# Patient Record
Sex: Female | Born: 1975 | Race: White | Hispanic: Yes | Marital: Single | Smoking: Never smoker
Health system: Southern US, Community
[De-identification: ages and names within clinical notes are randomized; demographics above are authoritative.]

## PROBLEM LIST (undated history)

## (undated) DIAGNOSIS — I10 Essential (primary) hypertension: Secondary | ICD-10-CM

## (undated) DIAGNOSIS — E7800 Pure hypercholesterolemia, unspecified: Secondary | ICD-10-CM

## (undated) DIAGNOSIS — E119 Type 2 diabetes mellitus without complications: Secondary | ICD-10-CM

---

## 2005-03-04 ENCOUNTER — Ambulatory Visit: Payer: Self-pay | Admitting: Family Medicine

## 2005-09-14 ENCOUNTER — Inpatient Hospital Stay: Payer: Self-pay

## 2005-09-14 ENCOUNTER — Ambulatory Visit: Payer: Self-pay | Admitting: Family Medicine

## 2005-10-04 ENCOUNTER — Inpatient Hospital Stay (HOSPITAL_COMMUNITY): Admission: AD | Admit: 2005-10-04 | Discharge: 2005-10-05 | Payer: Self-pay | Admitting: Family Medicine

## 2006-07-28 ENCOUNTER — Inpatient Hospital Stay (HOSPITAL_COMMUNITY): Admission: AD | Admit: 2006-07-28 | Discharge: 2006-07-30 | Payer: Self-pay | Admitting: Obstetrics & Gynecology

## 2014-09-20 ENCOUNTER — Emergency Department (HOSPITAL_COMMUNITY)
Admission: EM | Admit: 2014-09-20 | Discharge: 2014-09-21 | Disposition: A | Discharge status: Home / Self Care | Payer: Self-pay | Attending: Emergency Medicine | Admitting: Emergency Medicine

## 2014-09-20 ENCOUNTER — Encounter (HOSPITAL_COMMUNITY): Payer: Self-pay | Admitting: Emergency Medicine

## 2014-09-20 DIAGNOSIS — I10 Essential (primary) hypertension: Secondary | ICD-10-CM | POA: Insufficient documentation

## 2014-09-20 DIAGNOSIS — E119 Type 2 diabetes mellitus without complications: Secondary | ICD-10-CM | POA: Insufficient documentation

## 2014-09-20 DIAGNOSIS — Z8639 Personal history of other endocrine, nutritional and metabolic disease: Secondary | ICD-10-CM | POA: Insufficient documentation

## 2014-09-20 HISTORY — DX: Pure hypercholesterolemia, unspecified: E78.00

## 2014-09-20 HISTORY — DX: Type 2 diabetes mellitus without complications: E11.9

## 2014-09-20 HISTORY — DX: Essential (primary) hypertension: I10

## 2014-09-20 LAB — I-STAT CHEM 8, ED
BUN: 11 mg/dL (ref 6–20)
CREATININE: 0.5 mg/dL (ref 0.44–1.00)
Calcium, Ion: 1.16 mmol/L (ref 1.12–1.23)
Chloride: 101 mmol/L (ref 101–111)
Glucose, Bld: 227 mg/dL — ABNORMAL HIGH (ref 65–99)
HCT: 46 % (ref 36.0–46.0)
Hemoglobin: 15.6 g/dL — ABNORMAL HIGH (ref 12.0–15.0)
Potassium: 4.2 mmol/L (ref 3.5–5.1)
SODIUM: 137 mmol/L (ref 135–145)
TCO2: 26 mmol/L (ref 0–100)

## 2014-09-20 MED ORDER — LISINOPRIL-HYDROCHLOROTHIAZIDE 10-12.5 MG PO TABS: 1.0000 | ORAL_TABLET | Freq: Every day | ORAL | Status: AC

## 2014-09-20 MED ORDER — HYDROCHLOROTHIAZIDE 12.5 MG PO CAPS
12.5000 mg | ORAL_CAPSULE | Freq: Every day | ORAL | Status: DC
  Filled 2014-09-20: qty 1

## 2014-09-20 MED ORDER — LISINOPRIL 10 MG PO TABS
10.0000 mg | ORAL_TABLET | Freq: Once | ORAL | Status: DC
  Filled 2014-09-20: qty 1

## 2014-09-20 NOTE — Discharge Instructions (Signed)
Hipertensión °(Hypertension) °La hipertensión, conocida comúnmente como presión arterial alta, se produce cuando la sangre bombea en las arterias con mucha fuerza. Las arterias son los vasos sanguíneos que transportan la sangre desde el corazón hacia todas las partes del cuerpo. Una lectura de la presión arterial consiste en un número más alto sobre un número más bajo, por ejemplo, 110/72. El número más alto (presión sistólica) corresponde a la presión interna de las arterias cuando el corazón bombea sangre. El número más bajo (presión diastólica) corresponde a la presión interna de las arterias cuando el corazón se relaja. En condiciones ideales, la presión arterial debe ser inferior a 120/80. °La hipertensión fuerza al corazón a trabajar más para bombear la sangre. Las arterias pueden estrecharse o ponerse rígidas. La hipertensión conlleva el riesgo de enfermedad cardíaca, ictus y otros problemas.  °FACTORES DE RIESGO °Algunos factores de riesgo de hipertensión son controlables, pero otros no lo son.  °Entre los factores de riesgo que usted no puede controlar, se incluyen:  °· La raza. El riesgo es mayor para las personas afroamericanas. °· La edad. Los riesgos aumentan con la edad. °· El sexo. Antes de los 45 años, los hombres corren más riesgo que las mujeres. Después de los 65 años, las mujeres corren más riesgo que los hombres. °Entre los factores de riesgo que usted puede controlar, se incluyen: °· No hacer la cantidad suficiente de actividad física o ejercicio. °· Tener sobrepeso. °· Consumir mucha grasa, azúcar, calorías o sal en la dieta. °· Beber alcohol en exceso. °SIGNOS Y SÍNTOMAS °Por lo general, la hipertensión no causa signos o síntomas. La hipertensión demasiado alta (crisis hipertensiva) puede causar dolor de cabeza, ansiedad, falta de aire y hemorragia nasal. °DIAGNÓSTICO  °Para detectar si usted tiene hipertensión, el médico le medirá la presión arterial mientras esté sentado, con el brazo  levantado a la altura del corazón. Debe medirla al menos dos veces en el mismo brazo. Determinadas condiciones pueden causar una diferencia de presión arterial entre el brazo izquierdo y el derecho. El hecho de tener una sola lectura de la presión arterial más alta que lo normal no significa que necesita un tratamiento. En el caso de tener una lectura de la presión arterial con un valor alto, pídale al médico que la verifique nuevamente. °TRATAMIENTO  °El tratamiento de la hipertensión arterial incluye hacer cambios en el estilo de vida y, posiblemente, tomar medicamentos. Un estilo de vida saludable puede ayudar a bajar la presión arterial alta. Quizá deba cambiar algunos hábitos. °Los cambios en el estilo de vida pueden incluir: °· Seguir la dieta DASH. Esta dieta tiene un alto contenido de frutas, verduras y cereales integrales. Incluye poca cantidad de sal, carnes rojas y azúcares agregados. °· Hacer al menos 2 ½ horas de actividad física enérgica todas las semanas. °· Perder peso, si es necesario. °· No fumar. °· Limitar el consumo de bebidas alcohólicas. °· Aprender formas de reducir el estrés. °Si los cambios en el estilo de vida no son suficientes para lograr controlar la presión arterial, el médico puede recetarle medicamentos. Quizá necesite tomar más de uno. Trabaje en conjunto con su médico para comprender los riesgos y los beneficios. °INSTRUCCIONES PARA EL CUIDADO EN EL HOGAR °· Haga que le midan de nuevo la presión arterial según las indicaciones del médico. °· Tome los medicamentos solamente como se lo haya indicado el médico. Siga cuidadosamente las indicaciones. Los medicamentos para la presión arterial deben tomarse según las indicaciones. Los medicamentos pierden eficacia al omitir las dosis. El hecho de omitir   las dosis también aumenta el riesgo de otros problemas. °· No fume. °· Contrólese la presión arterial en su casa según las indicaciones del médico. °SOLICITE ATENCIÓN MÉDICA SI:  °· Piensa  que tiene una reacción alérgica a los medicamentos. °· Tiene mareos o dolores de cabeza con recurrencia. °· Tiene hinchazón en los tobillos. °· Tiene problemas de visión. °SOLICITE ATENCIÓN MÉDICA DE INMEDIATO SI: °· Siente un dolor de cabeza intenso o confusión. °· Siente debilidad inusual, adormecimiento o que se desmayará. °· Siente dolor intenso en el pecho o en el abdomen. °· Vomita repetidas veces. °· Tiene dificultad para respirar. °ASEGÚRESE DE QUE:  °· Comprende estas instrucciones. °· Controlará su afección. °· Recibirá ayuda de inmediato si no mejora o si empeora. °Document Released: 03/16/2005 Document Revised: 07/31/2013 °ExitCare® Patient Information ©2015 [employer]. This information is not intended to replace advice given to you by your health care provider. Make sure you discuss any questions you have with your health care provider. ° °

## 2014-09-20 NOTE — ED Provider Notes (Signed)
CSN: [id number]     Arrival date & time 09/20/14  1915 History   First MD Initiated Contact with Patient 09/20/14 2208     Chief Complaint  Patient presents with  . Hypertension   HPI The patient was evaluated at an urgent care today for hypertension. They were concerned about her persistently elevated blood pressure, 170/100 and felt she had hypertensive urgency and needed to come to the emergency room for evaluation. She was given a dose of clonidine. She denies any trouble with chest pain. She denies any shortness of breath. She denies any headache. She denies any numbness or weakness. Past Medical History  Diagnosis Date  . Hypertension   . Diabetes mellitus without complication   . Hypercholesterolemia    History reviewed. No pertinent past surgical history. No family history on file. History  Substance Use Topics  . Smoking status: Never Smoker   . Smokeless tobacco: Not on file  . Alcohol Use: No   OB History    No data available     Review of Systems  All other systems reviewed and are negative.     Allergies  Review of patient's allergies indicates no known allergies.  Home Medications   Prior to Admission medications   Medication Sig Start Date End Date Taking? Authorizing Provider  lisinopril-hydrochlorothiazide (ZESTORETIC) 10-12.5 MG per tablet Take 1 tablet by mouth daily. 09/20/14   Linwood Dibbles, MD   BP 127/69 mmHg  Pulse 84  Temp(Src) 98.4 F (36.9 C) (Oral)  Resp 16  Ht 4\' 10"  (1.473 m)  Wt 135 lb 4.8 oz (61.372 kg)  BMI 28.29 kg/m2  SpO2 98% Physical Exam  Constitutional: She appears well-developed and well-nourished. No distress.  HENT:  Head: Normocephalic and atraumatic.  Right Ear: External ear normal.  Left Ear: External ear normal.  Eyes: Conjunctivae are normal. Right eye exhibits no discharge. Left eye exhibits no discharge. No scleral icterus.  Neck: Neck supple. No tracheal deviation present.  Cardiovascular: Normal rate, regular  rhythm and intact distal pulses.   Pulmonary/Chest: Effort normal and breath sounds normal. No stridor. No respiratory distress. She has no wheezes. She has no rales.  Abdominal: Soft. Bowel sounds are normal. She exhibits no distension. There is no tenderness. There is no rebound and no guarding.  Musculoskeletal: She exhibits no edema or tenderness.  Neurological: She is alert. She has normal strength. No cranial nerve deficit (no facial droop, extraocular movements intact, no slurred speech) or sensory deficit. She exhibits normal muscle tone. She displays no seizure activity. Coordination normal.  Skin: Skin is warm and dry. No rash noted.  Psychiatric: She has a normal mood and affect.  Nursing note and vitals reviewed.   ED Course  Procedures (including critical care time) Labs Review Labs Reviewed  I-STAT CHEM 8, ED - Abnormal; Notable for the following:    Glucose, Bld 227 (*)    Hemoglobin 15.6 (*)    All other components within normal limits    Reviewed EKG from the urgent care.  No signs of acute ischemia  MDM   Final diagnoses:  Essential hypertension    Pt's bp decreased without any meds in the ED.  Pt does not have HTN urgency or emergency.  Asymptomatic htn.  Pt given an RX for Lininopril HCTZ and instructed to follow up with a PCP   Linwood Dibbles, MD 09/21/14 1339

## 2014-09-20 NOTE — ED Notes (Signed)
Translator used.   

## 2014-09-20 NOTE — ED Notes (Signed)
MD at bedside.Knapp 

## 2014-09-20 NOTE — ED Notes (Signed)
Pt. seen at [facility] urgent care today for hypertension , advised to go to ER for further evaluation , denies pain or discomfort/respirations unlabored.

## 2014-10-11 ENCOUNTER — Other Ambulatory Visit (HOSPITAL_COMMUNITY): Payer: Self-pay | Admitting: Nurse Practitioner

## 2014-10-11 DIAGNOSIS — Z1231 Encounter for screening mammogram for malignant neoplasm of breast: Secondary | ICD-10-CM

## 2014-10-18 ENCOUNTER — Ambulatory Visit (HOSPITAL_COMMUNITY): Payer: Self-pay

## 2014-10-30 ENCOUNTER — Ambulatory Visit (HOSPITAL_COMMUNITY)
Admission: RE | Admit: 2014-10-30 | Discharge: 2014-10-30 | Disposition: A | Discharge status: Home / Self Care | Payer: Self-pay | Source: Ambulatory Visit | Attending: Nurse Practitioner | Admitting: Nurse Practitioner

## 2014-10-30 DIAGNOSIS — Z1231 Encounter for screening mammogram for malignant neoplasm of breast: Secondary | ICD-10-CM

## 2014-11-01 ENCOUNTER — Other Ambulatory Visit: Payer: Self-pay | Admitting: Nurse Practitioner

## 2014-11-01 DIAGNOSIS — R928 Other abnormal and inconclusive findings on diagnostic imaging of breast: Secondary | ICD-10-CM

## 2014-11-08 ENCOUNTER — Telehealth (HOSPITAL_COMMUNITY): Payer: Self-pay | Admitting: *Deleted

## 2014-11-08 NOTE — Telephone Encounter (Signed)
Telephoned patient at home # and left message to return call to [facility]. Used interpreter Julie Sowell. 

## 2014-11-16 ENCOUNTER — Other Ambulatory Visit (HOSPITAL_COMMUNITY): Payer: Self-pay | Admitting: *Deleted

## 2014-11-16 DIAGNOSIS — R928 Other abnormal and inconclusive findings on diagnostic imaging of breast: Secondary | ICD-10-CM

## 2014-11-29 ENCOUNTER — Encounter (HOSPITAL_COMMUNITY): Payer: Self-pay

## 2014-11-29 ENCOUNTER — Ambulatory Visit (HOSPITAL_COMMUNITY)
Admission: RE | Admit: 2014-11-29 | Discharge: 2014-11-29 | Disposition: A | Discharge status: Home / Self Care | Payer: Self-pay | Source: Ambulatory Visit | Attending: Obstetrics and Gynecology | Admitting: Obstetrics and Gynecology

## 2014-11-29 ENCOUNTER — Ambulatory Visit: Payer: Self-pay

## 2014-11-29 VITALS — BP 200/110 | Temp 98.4°F | Ht 59.0 in | Wt 137.0 lb

## 2014-11-29 DIAGNOSIS — R928 Other abnormal and inconclusive findings on diagnostic imaging of breast: Secondary | ICD-10-CM

## 2014-11-29 NOTE — Progress Notes (Signed)
Patient ID: Kelsey Hogan, female   DOB: Feb 03, 1976, 39 y.o.   MRN: [id number] Used interpreter [name]. Signed patient up for [facility] program to help find PCP. Patient blood pressure was elevated and patient states is taking prescribed medication daily. Patient states no headaches, no dizziness, and no chest tightness. If symptoms develop patient will go to ED for evaluation.

## 2014-11-29 NOTE — Progress Notes (Signed)
CLINIC:   Breast & Cervical Cancer Control Program (B[facility]CP) Clinic  REASON FOR VISIT: Well-woman exam   HISTORY OF PRESENT ILLNESS:   [name] is a 39 y.o. female who presents to the [facility] today for clinical breast exam. She is accompanied by an interpreter, Kelsey Hogan. Her last mammogram was in 10/2014 and additional imaging of both breasts was recommended.  Her last pap smear was performed in 2015 and was negative. No history of abnormal pap.   REVIEW OF SYSTEMS:   Denies breast pain, nodularity, skin changes, nipple inversion, or nipple discharge bilaterally.  Denies any pelvic pain, pressure, or abnormal vaginal bleeding. Denies headaches, dizziness, and chest pain.   ALLERGIES: No Known Allergies  MEDICATIONS:  Current outpatient prescriptions:  .  lisinopril-hydrochlorothiazide (ZESTORETIC) 10-12.5 MG per tablet, Take 1 tablet by mouth daily., Disp: 60 tablet, Rfl: 1  PHYSICAL EXAM:   BP 200/110 mmHg  Temp(Src) 98.4 F (36.9 C) (Oral)  Ht  (1.499 m)  Wt 137 lb (62.143 kg)  BMI 27.66 kg/m2  LMP 11/28/2014 (Exact Date)  General: Well-nourished, well-appearing female in no acute distress.  She is accompanied by a translator in clinic today.  Stoney Bang, LPN was present during physical exam for this patient.   Breasts: Bilateral breasts exposed and observed with patient standing (arms at side, arms on hips, arms on hips flexed forward, and arms over head).  No gross abnormalities including breast skin puckering or dimpling noted on observation.  Breasts symmetrical without evidence of skin redness, thickening, or peau d'orange appearance. No nipple retraction or nipple discharge noted bilaterally.  No breast nodularity palpated in bilateral breasts.. Axillary lymph nodes: No axillary lymphadenopathy bilaterally.      ASSESSMENT & PLAN:  1. Breast cancer screening: [name] has no palpable breast abnormalities on her clinical breast exam  today.  She will receive her diagnostic mammogram as scheduled.  She will be contacted by the imaging center for results of the mammogram. She was given instructions and educational materials regarding breast self-awareness. [name] is aware of this plan and agrees with it.   2. Cervical cancer screening: [name] had a negative pap in 2015. Repeat pap not indicated today.  3. HTN: The patient did not take her BP medication today prior to her visit. She took her BP medication while here and BP came down to 176/94. She was instructed to follow up with PCP for ongoing management of BP. She was instructed to go to ER if she develops any headaches, dizziness, chest pain, or any other concerning symptoms.    [name] was encouraged to ask questions and all questions were answered to her satisfaction.      Clenton Pare, DNP, AGPCNP-BC, [facility] [phone]

## 2014-11-30 ENCOUNTER — Ambulatory Visit (HOSPITAL_BASED_OUTPATIENT_CLINIC_OR_DEPARTMENT_OTHER): Payer: Self-pay

## 2014-11-30 ENCOUNTER — Ambulatory Visit: Payer: Self-pay

## 2014-11-30 VITALS — BP 162/88 | HR 96 | Temp 98.3°F | Resp 18 | Ht 59.5 in | Wt 134.5 lb

## 2014-11-30 DIAGNOSIS — Z0000 Encounter for general adult medical examination without abnormal findings: Secondary | ICD-10-CM

## 2014-11-30 LAB — LIPID PANEL
CHOL/HDL RATIO: 5.6 ratio — AB (ref <=5.0)
CHOLESTEROL: 246 mg/dL — AB (ref 125–200)
HDL: 44 mg/dL — ABNORMAL LOW (ref >=46)
LDL Cholesterol: 162 mg/dL — ABNORMAL HIGH (ref <130)
Triglycerides: 198 mg/dL — ABNORMAL HIGH (ref <150)
VLDL: 40 mg/dL — ABNORMAL HIGH (ref <30)

## 2014-11-30 LAB — HEMOGLOBIN A1C
HEMOGLOBIN A1C: 10.2 % — AB (ref <5.7)
MEAN PLASMA GLUCOSE: 246 mg/dL — AB (ref <117)

## 2014-11-30 LAB — GLUCOSE (CC13): Glucose: 215 mg/dl — ABNORMAL HIGH (ref 70–140)

## 2014-11-30 NOTE — Patient Instructions (Signed)
Discussed health assessment with patient .Will be called with results of lab work and  then discussed any further follow up the patient needs. Patient will implement behavior modifications to help lower BP. Patient verbalized understanding.

## 2014-11-30 NOTE — Progress Notes (Signed)
Patient is a new patient to the [facility] program and is currently a [facility] patient effective 11/29/2014 without interpreter but has friend Wallene Huh to interprete.   Clinical Measurements: Patient is 4 ft. 11 1/2 inches, weight 134.5 lbs  Medical History: Patient has  history of high cholesterol and is on daily medicine in the evening. Patient does have a history of hypertension and diabetes. Patient takes Metformin and glipizide for diabetes. Patient showed that takes combination of lisinopril and HCTZ for HTN. Patient says that she takes all medication everyday and night as ordered. Per patient no diagnosed history of coronary heart disease, heart attack, heart failure, stroke/TIA, vascular disease or congenital heart defects. Patient stated that her PCP is Dr. Wynelle Link at [facility] on [address].  Blood Pressure, Self-measurement: Patient stated that checks Blood pressure about once a month at [facility] because does not have equipment to check blood pressure. Patient stated that does not share results with doctor..  Nutrition Assessment: Patient stated that eats 4 fruits every day. Patient states she eats 2 servings of vegetables a day. Per patient states does eat 3 or more ounces of whole grains daily. Patient stated doesn't eat two or more servings of fish weekly but only sometimes. Patient states she does not drink more than 36 ounces or 450 calories of beverages with added sugars weekly.Patient stated she does not watch her salt intake. Discussed with patient the need to cut back or stop using salt.  Physical Activity Assessment: Patient stated that gets exercise in the kitchen at work. Patient does around 1688  minutes of moderate exercise a week and does not do any vigorous exercise.  Smoking Status: Patient has never smoked and is not exposed to smoke.   Quality of Life Assessment: In assessing patient's quality of life she stated that out of the past 30 days that she has felt her  health is good all of them. Patient also stated that in the past 30 days that her mental health was not  good including stress, depression and problems with emotions for four days. Patient did state that out of the past 30 days she felt her physical or mental health had not kept her from doing her usual activities including self-care, work or recreation.   Plan: Lab work will be done today including a lipid panel, blood glucose, and Hgb A1C. Will call lab results when they are finished. Will discuss risk reduction counseling when call results.

## 2014-12-06 ENCOUNTER — Telehealth: Payer: Self-pay

## 2014-12-06 ENCOUNTER — Other Ambulatory Visit (HOSPITAL_COMMUNITY): Payer: Self-pay | Admitting: Obstetrics and Gynecology

## 2014-12-06 ENCOUNTER — Ambulatory Visit
Admission: RE | Admit: 2014-12-06 | Discharge: 2014-12-06 | Disposition: A | Discharge status: Home / Self Care | Payer: No Typology Code available for payment source | Source: Ambulatory Visit | Attending: Obstetrics and Gynecology | Admitting: Obstetrics and Gynecology

## 2014-12-06 DIAGNOSIS — R928 Other abnormal and inconclusive findings on diagnostic imaging of breast: Secondary | ICD-10-CM

## 2014-12-06 NOTE — Telephone Encounter (Signed)
Called to inform about lab work from 11/30/14. Interpreter, [name] informed patient: cholesterol- 246, HDL- 44, LDL-162, triglycerides -198, Bld Glucose -215 and HBG-A1C - 10.2. Did risk reduction counseling concerning carbohydrates and sugars. Patient stated that was interested in [employer]. Has appointment for orange card at 9:30 AM on 12/07/14. Set up health Coaching for September 16 at 11 AM at cancer center. Also, called friend to help remind patient to come.

## 2014-12-14 ENCOUNTER — Ambulatory Visit: Payer: No Typology Code available for payment source

## 2014-12-14 DIAGNOSIS — Z789 Other specified health status: Secondary | ICD-10-CM

## 2014-12-14 NOTE — Patient Instructions (Signed)
Will measure portion sizes. Will not eat more than 5 to 6 servings carbohydrates a day. Will measure serving sizes. Will read food labels. Will go to PCP within next month.

## 2014-12-14 NOTE — Progress Notes (Signed)
Patient returns today for risk reduction counseling, readiness to change and Health Coaching with Delia and had interpreter Tito Dine.  RISK REDUCTION COUNSELING and READINESS to CHANGE: Patient and I went over lab results mainly look at abnormal values, Blood Pressure and all lab rsults. Discussed A1C and diabetes with decreasing carbohydrates.Patient stated had been eating  A lot of tortilla's and has cut back. Discussed using whole grains  and increasing fiber in diet. Patient and I discussed portion sizes. Patient is ready for change (3) and made goal to decrease number carbohydrates she eats and portion control. Received handouts on A1C numbers, BMI, height and weight, all in [place].  HEALTH COACHING: Patient stated that her goal was to decrease carbohydrates and use portion control. To accomplish this: patient stated that will measure portions and eat less carbohydrates. Patient and I discussed serving sizes and portions of each food group she could eat each day.Patient received the following handouts in Spanish: diabetic meal plan, and Be Serving Wise. Patient also received measuring cup. Patient stated that did not have questions.  PLAN: Will  Decrease portions of carbohydrates she eats and measure portion sizes. Will implement changes in small steps.Will call patient in three to four weeks.

## 2015-01-04 ENCOUNTER — Telehealth (HOSPITAL_COMMUNITY): Payer: Self-pay

## 2015-01-04 NOTE — Telephone Encounter (Signed)
Called patient to see how was doing with medicine,sugars, carbohydrates, exercise and following up with her Primary Care Doctor per [name] interpreter. Patient stated that had to change appointment because her ride could not take her. Per patient appointment rescheduled for week of 01/06/15. Patient stated that was taking medications and watching what she eats. Interpreter mentioned that patient sounded much better than last time she talked to patient. Will follow up again in one month.

## 2015-02-13 NOTE — Addendum Note (Signed)
Encounter addended by: Elic Vencill P Kennia Vanvorst, RN on: 02/13/2015  5:04 PM<BR>     Documentation filed: Charges VN

## 2015-03-13 ENCOUNTER — Telehealth: Payer: Self-pay

## 2015-03-13 NOTE — Telephone Encounter (Signed)
Stated that had recently gone to her doctor and had labs done. Patient stated that the doctor stated that they were all good. Informed patient that will call back in a month for follow up assessment.

## 2015-06-04 ENCOUNTER — Telehealth (HOSPITAL_COMMUNITY): Payer: Self-pay | Admitting: *Deleted

## 2015-06-04 ENCOUNTER — Other Ambulatory Visit: Payer: Self-pay | Admitting: Nurse Practitioner

## 2015-06-04 DIAGNOSIS — R921 Mammographic calcification found on diagnostic imaging of breast: Secondary | ICD-10-CM

## 2015-06-04 NOTE — Telephone Encounter (Signed)
Attempted to call patient with Spanish interpreter Alison StallingMarley to remind her she is due for her bilateral diagnostic mammogram and [facility]. No one answered phone. Left message for patient to call me back.

## 2015-06-07 ENCOUNTER — Other Ambulatory Visit: Payer: Self-pay | Admitting: Obstetrics and Gynecology

## 2015-06-07 DIAGNOSIS — R921 Mammographic calcification found on diagnostic imaging of breast: Secondary | ICD-10-CM

## 2015-06-21 ENCOUNTER — Ambulatory Visit
Admission: RE | Admit: 2015-06-21 | Discharge: 2015-06-21 | Disposition: A | Discharge status: Home / Self Care | Payer: No Typology Code available for payment source | Source: Ambulatory Visit | Attending: Obstetrics and Gynecology | Admitting: Obstetrics and Gynecology

## 2015-06-21 DIAGNOSIS — R921 Mammographic calcification found on diagnostic imaging of breast: Secondary | ICD-10-CM

## 2015-11-14 NOTE — Telephone Encounter (Signed)
This encounter was created in error - please disregard.

## 2015-12-17 ENCOUNTER — Other Ambulatory Visit (HOSPITAL_COMMUNITY): Payer: Self-pay | Admitting: *Deleted

## 2015-12-17 DIAGNOSIS — R921 Mammographic calcification found on diagnostic imaging of breast: Secondary | ICD-10-CM

## 2015-12-26 ENCOUNTER — Ambulatory Visit
Admission: RE | Admit: 2015-12-26 | Discharge: 2015-12-26 | Disposition: A | Discharge status: Home / Self Care | Payer: No Typology Code available for payment source | Source: Ambulatory Visit | Attending: Obstetrics and Gynecology | Admitting: Obstetrics and Gynecology

## 2015-12-26 ENCOUNTER — Ambulatory Visit (HOSPITAL_COMMUNITY)
Admission: RE | Admit: 2015-12-26 | Discharge: 2015-12-26 | Disposition: A | Discharge status: Home / Self Care | Payer: Self-pay | Source: Ambulatory Visit | Attending: Obstetrics and Gynecology | Admitting: Obstetrics and Gynecology

## 2015-12-26 ENCOUNTER — Encounter (HOSPITAL_COMMUNITY): Payer: Self-pay

## 2015-12-26 VITALS — BP 180/100 | Temp 98.7°F | Ht 60.0 in | Wt 133.8 lb

## 2015-12-26 DIAGNOSIS — R921 Mammographic calcification found on diagnostic imaging of breast: Secondary | ICD-10-CM

## 2015-12-26 DIAGNOSIS — Z1239 Encounter for other screening for malignant neoplasm of breast: Secondary | ICD-10-CM

## 2015-12-26 NOTE — Progress Notes (Signed)
Encouraged patient to follow up with PCP today. No complaints of dizziness, headaches, or blurred vision. Patient will see PCP today.  Patient voiced understanding.

## 2015-12-26 NOTE — Patient Instructions (Addendum)
Explained breast self awareness to Kelsey Hogan. Patient did not need a Pap smear today due to last Pap smear was in June or July 2017per patient. Let her know [facility] will cover Pap smears every 3 years unless has a history of abnormal Pap smears. [facility] for diagnostic bilateral diagnostic mamm[facility] per recommendation. Appointment scheduled for Thursday, December 26, 2015 at 0840. Explained the importance of following up with PCP about her high blood pressure today. Patient stated she has an appointment this afternoon with her PCP and will let him/her know.  Kelsey Hogan verbalized understanding.  Juell Radney, Kathaleen Maserhristine Poll, RN 9:17 AM

## 2015-12-26 NOTE — Progress Notes (Signed)
No complaints today. [facility] due to recommending 6 month bilateral diagnostic mammogram. Last bilateral diagnostic mammogram was completed 06/21/2015 at the [facility].  Pap Smear:  Pap smear not completed today. [facility] and normal per patient. Per patient has no history of an abnormal Pap smear. No Pap smear results are in EPIC. Will get copy of most recent Pap smear and scan into EPIC.  Physical exam: Breasts Breasts symmetrical. No skin abnormalities bilateral breasts. No nipple retraction bilateral breasts. No nipple discharge bilateral breasts. No lymphadenopathy. No lumps palpated bilateral breasts. No complaints of pain or tenderness on exam. [facility] for diagnostic bilateral diagnostic mammogram per recommendation. Appointment scheduled for Thursday, December 26, 2015 at 0840.        Pelvic/Bimanual No Pap smear completed today since last Pap smear was in June or July 2017 per patient. Pap smear not indicated per BCCCP guidelines.   Smoking History: Patient has never smoked.  Patient Navigation: Patient education provided. [facility] services [facility]. Spanish interpreter provided.   Used Spanish interpreter [employer]athy Hinshaw from [employer].

## 2015-12-26 NOTE — Addendum Note (Signed)
Encounter addended by: Priscille Heidelberghristine P Ayva Veilleux, RN on: 12/26/2015  9:47 AM<BR>    Actions taken: Sign clinical note

## 2015-12-31 ENCOUNTER — Encounter (HOSPITAL_COMMUNITY): Payer: Self-pay | Admitting: *Deleted

## 2016-11-03 ENCOUNTER — Other Ambulatory Visit: Payer: Self-pay | Admitting: Obstetrics and Gynecology

## 2016-11-03 DIAGNOSIS — R921 Mammographic calcification found on diagnostic imaging of breast: Secondary | ICD-10-CM

## 2016-12-31 ENCOUNTER — Ambulatory Visit (HOSPITAL_COMMUNITY): Payer: No Typology Code available for payment source

## 2017-01-04 ENCOUNTER — Encounter (HOSPITAL_COMMUNITY): Payer: Self-pay

## 2017-01-11 ENCOUNTER — Other Ambulatory Visit (HOSPITAL_COMMUNITY): Payer: Self-pay | Admitting: *Deleted

## 2017-01-11 DIAGNOSIS — R921 Mammographic calcification found on diagnostic imaging of breast: Secondary | ICD-10-CM

## 2017-01-28 ENCOUNTER — Encounter (HOSPITAL_COMMUNITY): Payer: Self-pay

## 2017-01-28 ENCOUNTER — Ambulatory Visit (HOSPITAL_COMMUNITY)
Admission: RE | Admit: 2017-01-28 | Discharge: 2017-01-28 | Disposition: A | Discharge status: Home / Self Care | Payer: Self-pay | Source: Ambulatory Visit | Attending: Obstetrics and Gynecology | Admitting: Obstetrics and Gynecology

## 2017-01-28 ENCOUNTER — Ambulatory Visit
Admission: RE | Admit: 2017-01-28 | Discharge: 2017-01-28 | Disposition: A | Discharge status: Home / Self Care | Payer: No Typology Code available for payment source | Source: Ambulatory Visit | Attending: Obstetrics and Gynecology | Admitting: Obstetrics and Gynecology

## 2017-01-28 VITALS — BP 198/106 | Temp 98.0°F | Ht 60.0 in | Wt 130.0 lb

## 2017-01-28 DIAGNOSIS — R921 Mammographic calcification found on diagnostic imaging of breast: Secondary | ICD-10-CM

## 2017-01-28 DIAGNOSIS — Z1239 Encounter for other screening for malignant neoplasm of breast: Secondary | ICD-10-CM

## 2017-01-28 NOTE — Progress Notes (Signed)
No complaints today. [facility] due to recommending 12 month bilateral diagnostic mammogram. Last bilateral diagnostic mammogram was completed 12/26/2015 at the [facility].   Pap Smear: Pap smear not completed today. [facility] July [facility] and normal per patient. Per patient has no history of an abnormal Pap smear. No Pap smear results are in EPIC.   Physical exam: Breasts Breasts symmetrical. No skin abnormalities bilateral breasts. No nipple retraction bilateral breasts. No nipple discharge bilateral breasts. No lymphadenopathy. No lumps palpated bilateral breasts. No complaints of pain or tenderness on exam. Ref[facility] to [facility] for diagnostic bilateral diagnostic mammogram per recommendation. Appointment scheduled for Thursday, January 28, 2017 at 0850.           Pelvic/Bimanual No Pap smear completed today since last Pap smear was in June or July 2017 per patient. Pap smear not indicated per BCCCP guidelines.   Smoking History: Patient has never smoked.  Patient Navigation: Patient education provided. Access to services provided for patient through [facility]. Spanish interpreter provided.   Used Spanish interpreter [employer]rika McReynolds from [employer].

## 2017-01-28 NOTE — Patient Instructions (Addendum)
Explained breast self Kelsey Hogan Patient did not need a Pap smear today due to last Pap smear was in June or July 2017 per patient. Let her know [facility] will cover Pap smears every 3 years unless has a history of abnormal Pap smears. [facility] for diagnostic bilateral diagnostic mammogram per recommendation. Appointment scheduled for Thursday, January 28, 2017 at 0850. Kelsey Hogan verbalized understanding.  Latron Ribas, Kathaleen Maserhristine Poll, RN 8:18 AM

## 2017-01-29 ENCOUNTER — Encounter (HOSPITAL_COMMUNITY): Payer: Self-pay

## 2018-02-01 IMAGING — MG 2D DIGITAL DIAGNOSTIC BILATERAL MAMMOGRAM WITH CAD AND ADJUNCT T
4 series · 4 of 4 positions shown · non-contrast
Comparison: Previous exam(s).

CLINICAL DATA: 40-year-old female presenting for follow-up of
probably benign bilateral breast calcifications.

EXAM:
2D DIGITAL DIAGNOSTIC BILATERAL MAMMOGRAM WITH CAD AND ADJUNCT TOMO

[L ML]
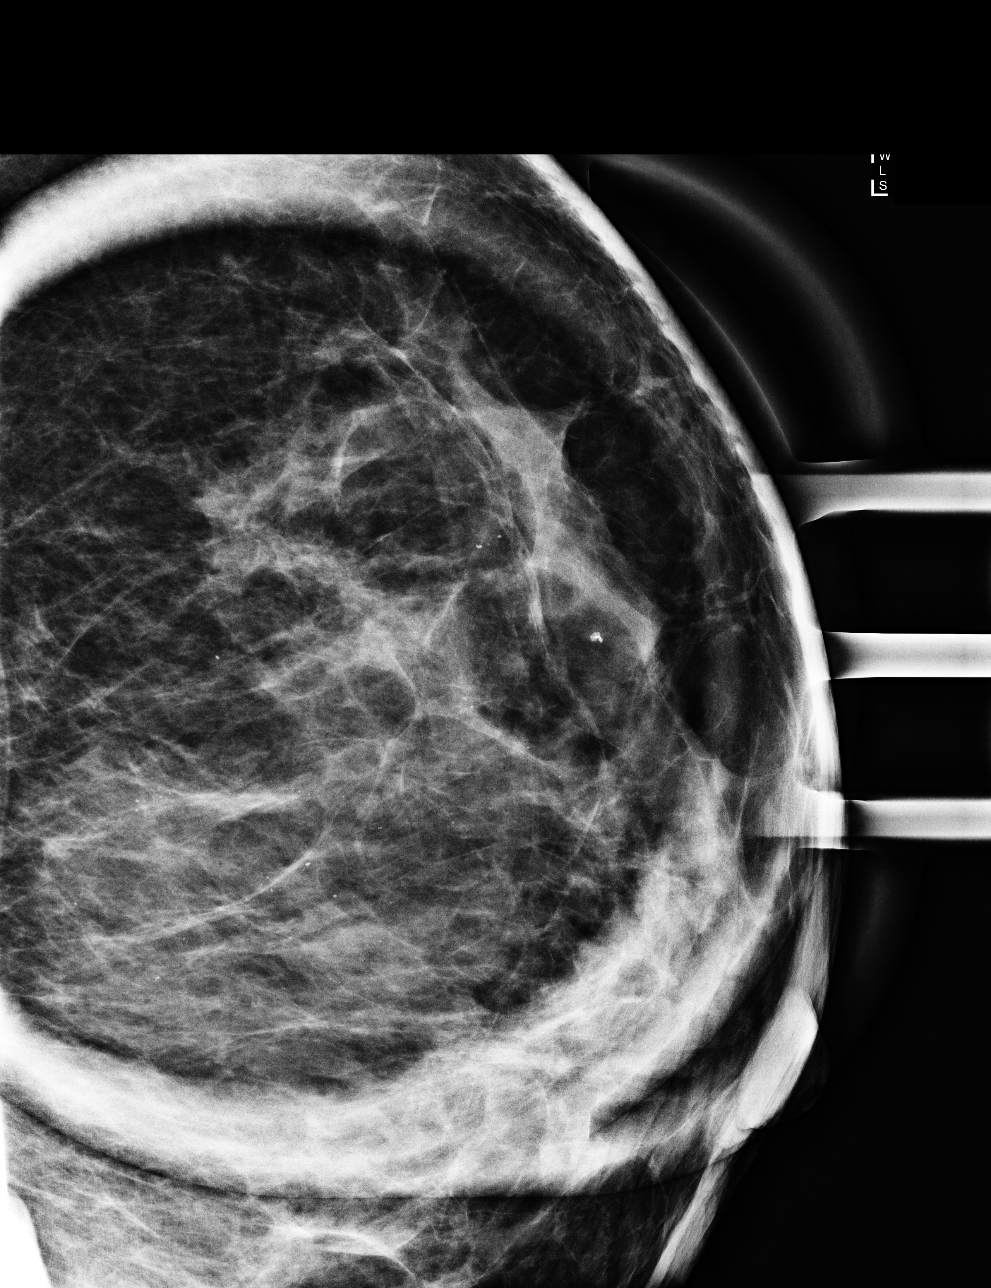

[R ML]
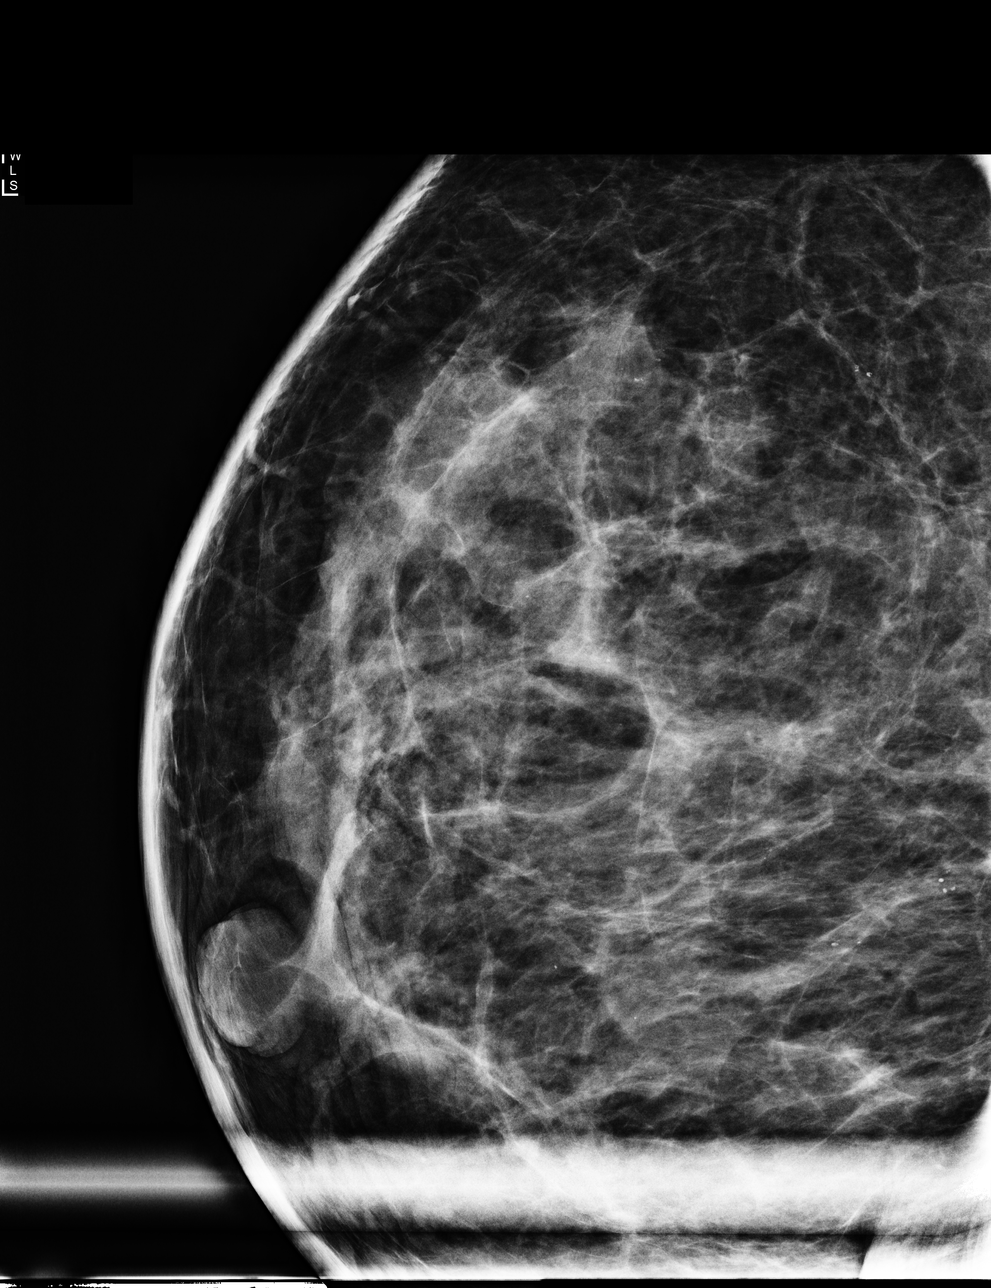

[R CC]
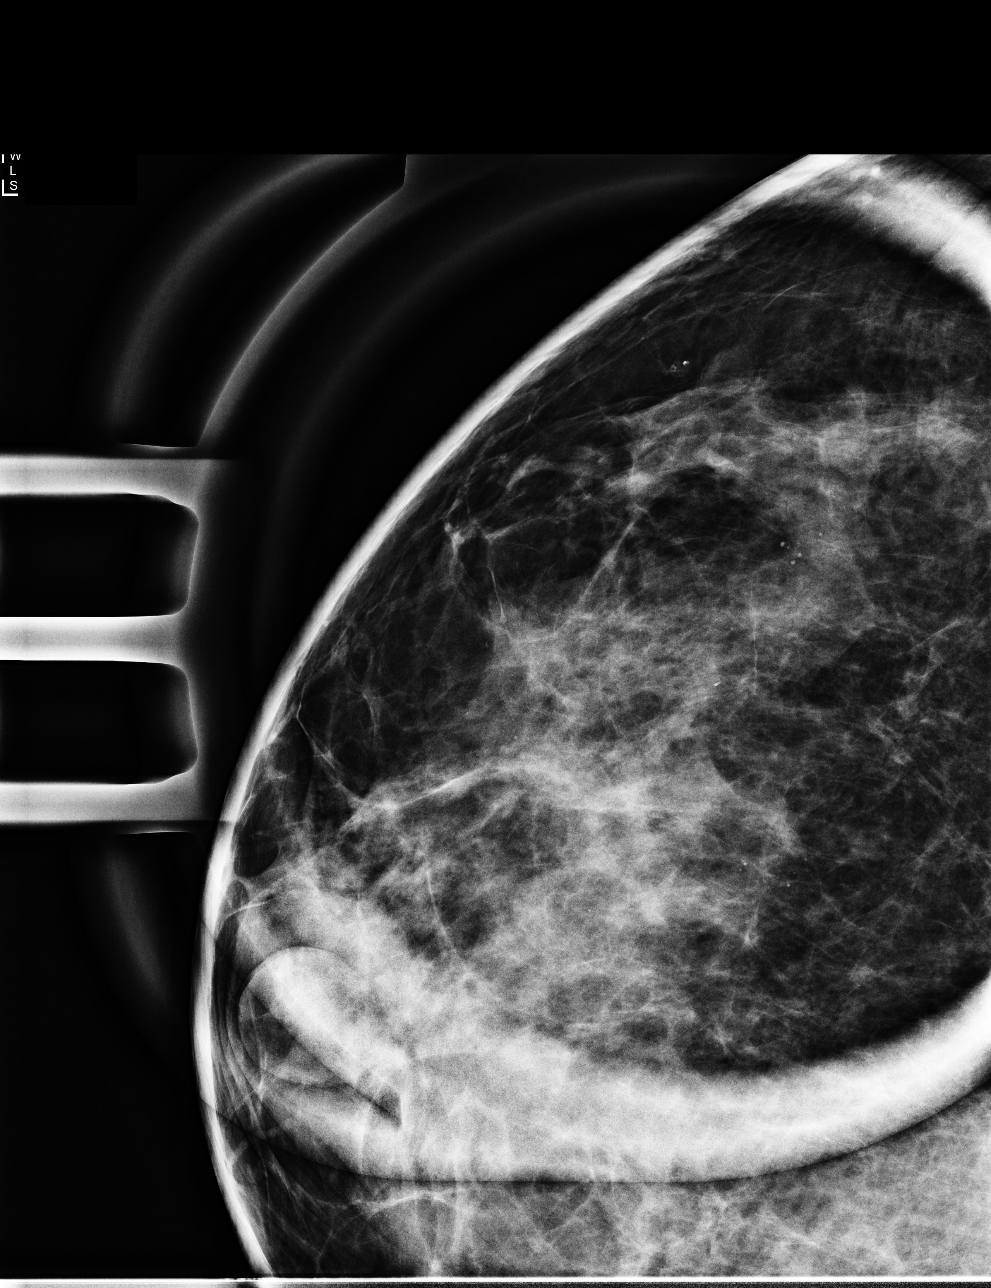

[L CC]
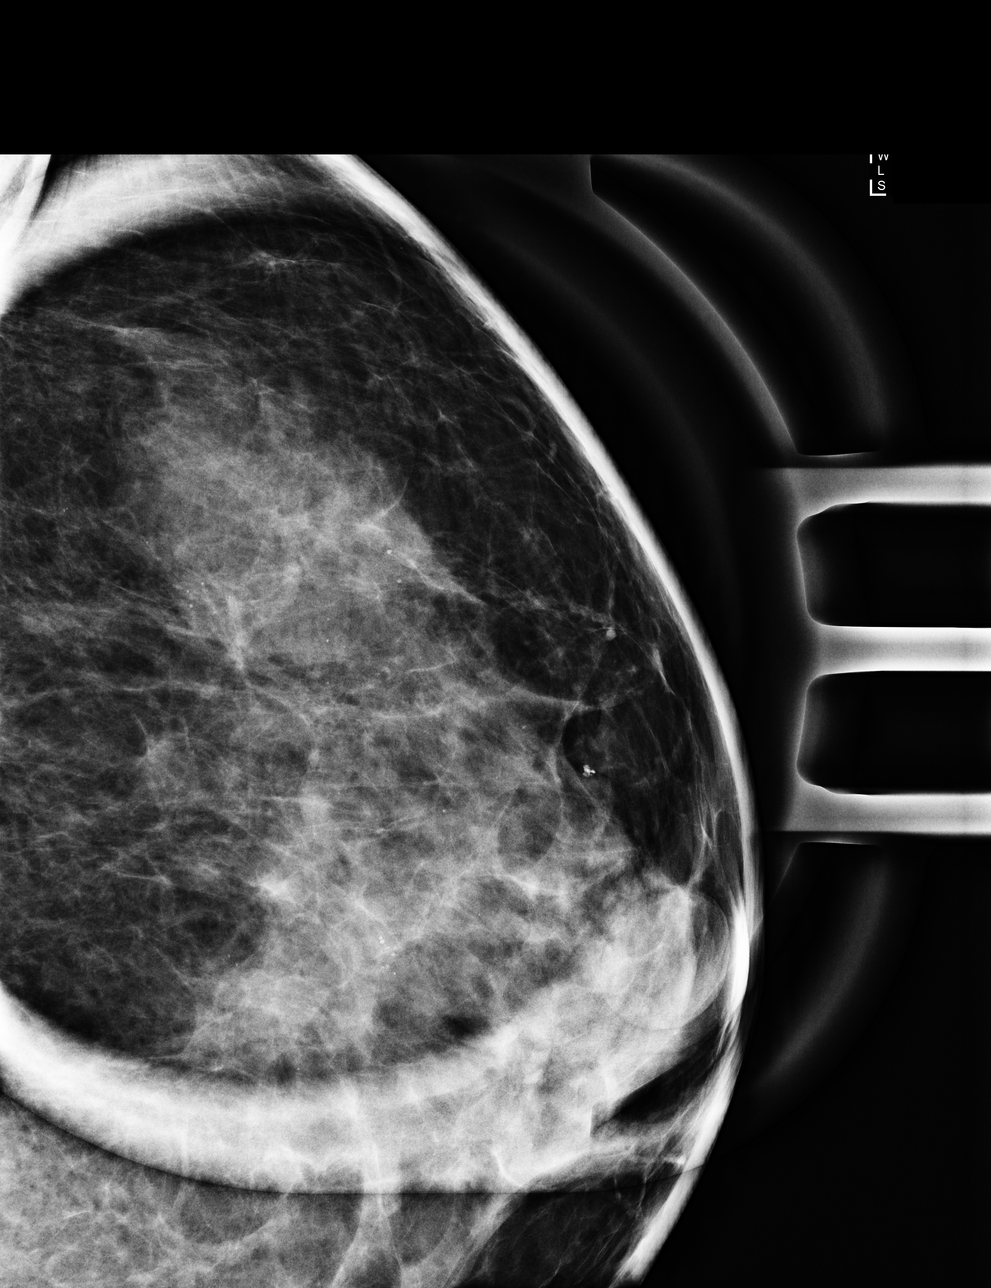

[4 of 4 positions shown; findings below may reference images not displayed]

ACR Breast Density Category c: The breast tissue is heterogeneously
dense, which may obscure small masses.
FINDINGS: The scattered calcifications in the superior breasts bilaterally are
stable. No suspicious calcifications, masses or areas of distortion
are seen in the bilateral breasts.

Mammographic images were processed with CAD.
IMPRESSION: 1. The probably benign calcifications in the bilateral breasts are
stable.

2.  No mammographic evidence of malignancy in the bilateral breasts.

RECOMMENDATION:
Diagnostic mammogram is suggested in 1 year to document 2 years of
stability. (Code:VX-X-M5L)

I have discussed the findings and recommendations with the patient.
Results were also provided in writing at the conclusion of the
visit. If applicable, a reminder letter will be sent to the patient
regarding the next appointment.

BI-RADS CATEGORY  3: Probably benign.
